# Patient Record
Sex: Male | Born: 1950 | Race: White | Marital: Married | Smoking: Former smoker
Health system: Southern US, Community
[De-identification: ages and names within clinical notes are randomized; demographics above are authoritative.]

## PROBLEM LIST (undated history)

## (undated) DIAGNOSIS — Z87442 Personal history of urinary calculi: Secondary | ICD-10-CM

## (undated) DIAGNOSIS — G4730 Sleep apnea, unspecified: Secondary | ICD-10-CM

## (undated) DIAGNOSIS — C801 Malignant (primary) neoplasm, unspecified: Secondary | ICD-10-CM

## (undated) DIAGNOSIS — K3533 Acute appendicitis with perforation and localized peritonitis, with abscess: Secondary | ICD-10-CM

## (undated) DIAGNOSIS — M109 Gout, unspecified: Secondary | ICD-10-CM

## (undated) DIAGNOSIS — F419 Anxiety disorder, unspecified: Secondary | ICD-10-CM

## (undated) DIAGNOSIS — H269 Unspecified cataract: Secondary | ICD-10-CM

## (undated) DIAGNOSIS — M1990 Unspecified osteoarthritis, unspecified site: Secondary | ICD-10-CM

## (undated) DIAGNOSIS — J302 Other seasonal allergic rhinitis: Secondary | ICD-10-CM

## (undated) DIAGNOSIS — F1020 Alcohol dependence, uncomplicated: Secondary | ICD-10-CM

## (undated) DIAGNOSIS — K219 Gastro-esophageal reflux disease without esophagitis: Secondary | ICD-10-CM

## (undated) DIAGNOSIS — F32A Depression, unspecified: Secondary | ICD-10-CM

## (undated) DIAGNOSIS — I10 Essential (primary) hypertension: Secondary | ICD-10-CM

## (undated) DIAGNOSIS — E785 Hyperlipidemia, unspecified: Secondary | ICD-10-CM

## (undated) DIAGNOSIS — E669 Obesity, unspecified: Secondary | ICD-10-CM

## (undated) HISTORY — PX: APPENDECTOMY: SHX54

## (undated) HISTORY — DX: Unspecified cataract: H26.9

## (undated) HISTORY — DX: Gastro-esophageal reflux disease without esophagitis: K21.9

## (undated) HISTORY — DX: Anxiety disorder, unspecified: F41.9

## (undated) HISTORY — PX: WISDOM TOOTH EXTRACTION: SHX21

## (undated) HISTORY — DX: Gout, unspecified: M10.9

## (undated) HISTORY — PX: HAMMER TOE SURGERY: SHX385

## (undated) HISTORY — DX: Other seasonal allergic rhinitis: J30.2

## (undated) HISTORY — DX: Essential (primary) hypertension: I10

## (undated) HISTORY — DX: Acute appendicitis with perforation, localized peritonitis, and gangrene, with abscess: K35.33

## (undated) HISTORY — DX: Depression, unspecified: F32.A

## (undated) HISTORY — PX: TONSILLECTOMY: SUR1361

## (undated) HISTORY — PX: COLONOSCOPY: SHX174

## (undated) HISTORY — DX: Obesity, unspecified: E66.9

## (undated) HISTORY — DX: Unspecified osteoarthritis, unspecified site: M19.90

## (undated) HISTORY — DX: Alcohol dependence, uncomplicated: F10.20

## (undated) HISTORY — DX: Hyperlipidemia, unspecified: E78.5

---

## 2015-03-11 ENCOUNTER — Encounter: Payer: Self-pay | Admitting: Gastroenterology

## 2015-03-11 ENCOUNTER — Other Ambulatory Visit (INDEPENDENT_AMBULATORY_CARE_PROVIDER_SITE_OTHER): Payer: BLUE CROSS/BLUE SHIELD

## 2015-03-11 ENCOUNTER — Ambulatory Visit (INDEPENDENT_AMBULATORY_CARE_PROVIDER_SITE_OTHER): Payer: BLUE CROSS/BLUE SHIELD | Admitting: Gastroenterology

## 2015-03-11 VITALS — BP 128/70 | HR 76 | Ht 74.0 in | Wt 267.0 lb

## 2015-03-11 DIAGNOSIS — K766 Portal hypertension: Secondary | ICD-10-CM | POA: Diagnosis not present

## 2015-03-11 LAB — CBC WITH DIFFERENTIAL/PLATELET
BASOS ABS: 0 10*3/uL (ref 0.0–0.1)
Basophils Relative: 0.7 % (ref 0.0–3.0)
EOS ABS: 0.2 10*3/uL (ref 0.0–0.7)
Eosinophils Relative: 2.4 % (ref 0.0–5.0)
HEMATOCRIT: 45.3 % (ref 39.0–52.0)
Hemoglobin: 15.3 g/dL (ref 13.0–17.0)
LYMPHS PCT: 32.4 % (ref 12.0–46.0)
Lymphs Abs: 2.1 10*3/uL (ref 0.7–4.0)
MCHC: 33.8 g/dL (ref 30.0–36.0)
MCV: 90.9 fl (ref 78.0–100.0)
MONO ABS: 0.6 10*3/uL (ref 0.1–1.0)
Monocytes Relative: 10 % (ref 3.0–12.0)
NEUTROS ABS: 3.5 10*3/uL (ref 1.4–7.7)
Neutrophils Relative %: 54.5 % (ref 43.0–77.0)
PLATELETS: 320 10*3/uL (ref 150.0–400.0)
RBC: 4.99 Mil/uL (ref 4.22–5.81)
RDW: 12.9 % (ref 11.5–15.5)
WBC: 6.4 10*3/uL (ref 4.0–10.5)

## 2015-03-11 LAB — COMPREHENSIVE METABOLIC PANEL
ALBUMIN: 4.2 g/dL (ref 3.5–5.2)
ALK PHOS: 62 U/L (ref 39–117)
ALT: 17 U/L (ref 0–53)
AST: 16 U/L (ref 0–37)
BUN: 23 mg/dL (ref 6–23)
CALCIUM: 10.2 mg/dL (ref 8.4–10.5)
CHLORIDE: 101 meq/L (ref 96–112)
CO2: 32 mEq/L (ref 19–32)
Creatinine, Ser: 1.05 mg/dL (ref 0.40–1.50)
GFR: 75.93 mL/min (ref >60.00)
Glucose, Bld: 106 mg/dL — ABNORMAL HIGH (ref 70–99)
POTASSIUM: 4.1 meq/L (ref 3.5–5.1)
SODIUM: 138 meq/L (ref 135–145)
Total Bilirubin: 0.3 mg/dL (ref 0.2–1.2)
Total Protein: 7.4 g/dL (ref 6.0–8.3)

## 2015-03-11 LAB — PROTIME-INR
INR: 1.1 ratio — AB (ref 0.8–1.0)
PROTHROMBIN TIME: 12.1 s (ref 9.6–13.1)

## 2015-03-11 NOTE — Patient Instructions (Addendum)
We will get records sent from your previous gastroenterologist in [place] for review.  This will include any endoscopic (colonoscopy or upper endoscopy) procedures and any associated pathology reports and also any recent office notes. After reviewing the above records, Dr. Ardis Hughs will decide on future testing (colonoscopy surveillance, screening). You will have labs checked today in the basement lab.  Please head down after you check out with the front desk  (cbc, cmet, inr). You will be set up for an ultrasound with elastography.  You will be set up for an ultrasound with duplex evaluation of liver vessels. Continue the most important thing, no more drinking.  You have been scheduled for an ultrasound at [facility] (1st floor of hospital) on 04-07-15 at 7:00am. Please arrive 15 minutes prior to your appointment for registration. Make certain not to have anything to eat or drink 6 hours prior to your appointment. Should you need to reschedule your appointment, please contact radiology at [phone].

## 2015-03-11 NOTE — Progress Notes (Signed)
HPI: This is a  very pleasant 64 year old man    who was referred to me by Margaretha Sheffield to evaluate  possibility of portal hypertension .    Chief complaint is " I was told I had portal hypertension"   I reviewed patient hand carried records from [facility], [place] from July 2016. This includes a chest abdomen and pelvic CT scan. "The liver is normal in size without gross evidence of masses or dilated intrahepatic ducts". He had a liver, spleen scan done that is reported as being normal. The reason for the exam was "portal hypertension". Labs from that admission which related to a perforated appendicitis that required laparoscopic converted to open appendectomy showed normal platelets his INR was elevated at 1.6 his albumin was 2.8 his bilirubin was normal as AST and ALP were both normal.  I wrote op note from that admission, August 2016 surgery for perforated appendicitis, chronic. Apparently he presented originally in July with a perforated appendicitis and a large abscess this underwent drain placement. His history was "notable for very large alcohol history" surgeon says that there was a modest amount of bleeding from just under the umbilicus that was "later confirmed to be related to portal hypertension"  Dizziness with looking up. [place] Alcoholic but stopped drinking about a month prior to his acute illness  Colonoscopy for colon cancer screening about 2 years ago was normal.  He has never had trouble with edema, encephalopathy, never any overt GI bleeding.  Review of systems: Pertinent positive and negative review of systems were noted in the above HPI section. Complete review of systems was performed and was otherwise normal.   Past Medical History  Diagnosis Date  . Appendicitis with abscess     No past surgical history on file.  Current Outpatient Prescriptions  Medication Sig Dispense Refill  . olmesartan-hydrochlorothiazide (BENICAR HCT) 20-12.5 MG tablet  Take 1 tablet by mouth daily.    Marland Kitchen omeprazole (PRILOSEC) 20 MG capsule Take 20 mg by mouth daily.    . traZODone (DESYREL) 100 MG tablet Take 100 mg by mouth at bedtime.    Marland Kitchen venlafaxine (EFFEXOR) 75 MG tablet Take 75 mg by mouth daily.    Marland Kitchen zolpidem (AMBIEN CR) 12.5 MG CR tablet Take 12.5 mg by mouth at bedtime as needed for sleep.     No current facility-administered medications for this visit.    Allergies as of 03/11/2015  . (No Known Allergies)    No family history on file.  Social History   Social History  . Marital Status: Married    Spouse Name: N/A  . Number of Children: 2  . Years of Education: N/A   Occupational History  . retired    Social History Main Topics  . Smoking status: Former Smoker -- 1.00 packs/day for 9 years    Types: Cigarettes    Quit date: 05/09/1980  . Smokeless tobacco: Not on file  . Alcohol Use: No  . Drug Use: Not on file  . Sexual Activity: Not on file   Other Topics Concern  . Not on file   Social History Narrative  . No narrative on file     Physical Exam: BP 128/70 mmHg  Pulse 76  Ht 6\' 2"  (1.88 m)  Wt 267 lb (121.11 kg)  BMI 34.27 kg/m2 Constitutional: generally well-appearing Psychiatric: alert and oriented x3 Eyes: extraocular movements intact Mouth: oral pharynx moist, no lesions Neck: supple no lymphadenopathy Cardiovascular: heart regular rate and rhythm Lungs:  clear to auscultation bilaterally Abdomen: soft, nontender, nondistended, no obvious ascites, no peritoneal signs, normal bowel sounds Extremities: no lower extremity edema bilaterally Skin: no lesions on visible extremities   Assessment and plan: 64 y.o. male with  previous alcoholism, question of underlying portal hypertension  I read his operative report and it does seem like there was some, more than usual bleeding surgically. It is not clear to me that however he has underlying cirrhosis or portal hypertension. His liver looked normal, nonnodular on  CT scan. He also had a liver spleen scan which was normal. He clearly was an alcoholic and he has stopped drinking for the past 4-6 months.  Clinically he has no signs of cirrhosis. I'm going to get set of labs today including a CBC, complete metabolic profile and coags to check for underlying biochemical signs of cirrhosis. I also recommended he have duplex ultrasound of his liver to check for signs of portal hypertension as well as an ultrasound with the last either physical look for subtle signs of fibrosis, cirrhosis.   Owens Loffler, MD [facility] 03/11/2015, 10:21 AM  Cc: Margaretha Sheffield, MD

## 2015-04-07 ENCOUNTER — Ambulatory Visit (HOSPITAL_COMMUNITY)
Admission: RE | Admit: 2015-04-07 | Discharge: 2015-04-07 | Disposition: A | Discharge status: Home / Self Care | Payer: BLUE CROSS/BLUE SHIELD | Source: Ambulatory Visit | Attending: Gastroenterology | Admitting: Gastroenterology

## 2015-04-07 DIAGNOSIS — K766 Portal hypertension: Secondary | ICD-10-CM | POA: Insufficient documentation

## 2019-05-20 DIAGNOSIS — H903 Sensorineural hearing loss, bilateral: Secondary | ICD-10-CM | POA: Insufficient documentation

## 2020-01-17 ENCOUNTER — Other Ambulatory Visit: Payer: Self-pay | Admitting: Urology

## 2020-01-17 DIAGNOSIS — R972 Elevated prostate specific antigen [PSA]: Secondary | ICD-10-CM

## 2020-01-17 DIAGNOSIS — Z77018 Contact with and (suspected) exposure to other hazardous metals: Secondary | ICD-10-CM

## 2020-02-10 ENCOUNTER — Inpatient Hospital Stay: Admission: RE | Admit: 2020-02-10 | Payer: BLUE CROSS/BLUE SHIELD | Source: Ambulatory Visit

## 2020-02-28 ENCOUNTER — Inpatient Hospital Stay: Admission: RE | Admit: 2020-02-28 | Payer: BLUE CROSS/BLUE SHIELD | Source: Ambulatory Visit

## 2020-02-29 ENCOUNTER — Other Ambulatory Visit: Payer: Self-pay

## 2020-02-29 ENCOUNTER — Ambulatory Visit
Admission: RE | Admit: 2020-02-29 | Discharge: 2020-02-29 | Disposition: A | Discharge status: Home / Self Care | Payer: Medicare HMO | Source: Ambulatory Visit | Attending: Urology | Admitting: Urology

## 2020-02-29 DIAGNOSIS — R972 Elevated prostate specific antigen [PSA]: Secondary | ICD-10-CM

## 2020-02-29 MED ORDER — GADOBENATE DIMEGLUMINE 529 MG/ML IV SOLN
20.0000 mL | Freq: Once | INTRAVENOUS | Status: AC | PRN
  Administered 2020-02-29: 20 mL via INTRAVENOUS

## 2020-10-26 ENCOUNTER — Ambulatory Visit (AMBULATORY_SURGERY_CENTER): Payer: BLUE CROSS/BLUE SHIELD

## 2020-10-26 ENCOUNTER — Other Ambulatory Visit: Payer: Self-pay

## 2020-10-26 VITALS — Ht 74.0 in | Wt 215.0 lb

## 2020-10-26 DIAGNOSIS — Z1211 Encounter for screening for malignant neoplasm of colon: Secondary | ICD-10-CM

## 2020-10-26 MED ORDER — SUTAB 1479-225-188 MG PO TABS: 1.0000 | ORAL_TABLET | ORAL | 0 refills | Status: DC

## 2020-10-26 NOTE — Progress Notes (Signed)
Pre visit completed via phone call; Patient verified name, DOB, and address; No egg or soy allergy known to patient  No issues with past sedation with any surgeries or procedures Patient denies ever being told they had issues or difficulty with intubation  No FH of Malignant Hyperthermia No diet pills per patient No home 02 use per patient  No blood thinners per patient  Pt denies issues with constipation  No A fib or A flutter  COVID 19 guidelines implemented in PV today with Pt and RN   Medicare Coupon given to pt in PV today, Code to Pharmacy and NO PA's for preps discussed with pt in PV today  Discussed with pt there will be an out-of-pocket cost for prep and that varies from $0 to 70 dollars   Due to the COVID-19 pandemic we are asking patients to follow certain guidelines.  Pt aware of COVID protocols and [facility] guidelines

## 2020-11-11 ENCOUNTER — Ambulatory Visit (AMBULATORY_SURGERY_CENTER): Payer: Medicare HMO | Admitting: Gastroenterology

## 2020-11-11 ENCOUNTER — Encounter: Payer: Self-pay | Admitting: Gastroenterology

## 2020-11-11 ENCOUNTER — Other Ambulatory Visit: Payer: Self-pay

## 2020-11-11 VITALS — BP 97/70 | HR 60 | Temp 97.5°F | Resp 21 | Ht 74.0 in | Wt 215.0 lb

## 2020-11-11 DIAGNOSIS — Z1211 Encounter for screening for malignant neoplasm of colon: Secondary | ICD-10-CM

## 2020-11-11 DIAGNOSIS — D123 Benign neoplasm of transverse colon: Secondary | ICD-10-CM

## 2020-11-11 DIAGNOSIS — K635 Polyp of colon: Secondary | ICD-10-CM

## 2020-11-11 MED ORDER — SODIUM CHLORIDE 0.9 % IV SOLN: 500.0000 mL | Freq: Once | INTRAVENOUS | Status: AC

## 2020-11-11 NOTE — Progress Notes (Signed)
A and O x3. Report to RN. Tolerated MAC anesthesia well. 

## 2020-11-11 NOTE — Op Note (Signed)
[name] Procedure Date: 11/11/2020 9:14 AM MRN: [id number] Endoscopist: Milus Banister , MD Age: 70 Referring MD:  Date of Birth: 10/01/1950 Gender: Male Account #: [id number] Procedure:                Colonoscopy Indications:              Screening for colorectal malignant neoplasm Medicines:                Monitored Anesthesia Care Procedure:                Pre-Anesthesia Assessment:                           - Prior to the procedure, a History and Physical                            was performed, and patient medications and                            allergies were reviewed. The patient's tolerance of                            previous anesthesia was also reviewed. The risks                            and benefits of the procedure and the sedation                            options and risks were discussed with the patient.                            All questions were answered, and informed consent                            was obtained. Prior Anticoagulants: The patient has                            taken no previous anticoagulant or antiplatelet                            agents. ASA Grade Assessment: II - A patient with                            mild systemic disease. After reviewing the risks                            and benefits, the patient was deemed in                            satisfactory condition to undergo the procedure.                           After obtaining informed consent, the colonoscope  was passed under direct vision. Throughout the                            procedure, the patient's blood pressure, pulse, and                            oxygen saturations were monitored continuously. The                            Olympus CF-HQ190L [id number]) Colonoscope was                            introduced through the anus and advanced to the the                            cecum, identified by  appendiceal orifice and                            ileocecal valve. The colonoscopy was performed                            without difficulty. The patient tolerated the                            procedure well. The quality of the bowel                            preparation was adequate. The ileocecal valve,                            appendiceal orifice, and rectum were photographed. Scope In: 9:25:54 AM Scope Out: 9:43:38 AM Scope Withdrawal Time: 0 hours 7 minutes 46 seconds  Total Procedure Duration: 0 hours 17 minutes 44 seconds  Findings:                 A 3 mm polyp was found in the transverse colon. The                            polyp was sessile. The polyp was removed with a                            cold snare. Resection and retrieval were complete.                           Multiple small-mouthed diverticula were found in                            the left colon.                           Internal hemorrhoids were found. The hemorrhoids                            were small.  The exam was otherwise without abnormality on                            direct and retroflexion views. Complications:            No immediate complications. Estimated blood loss:                            None. Estimated Blood Loss:     Estimated blood loss: none. Impression:               - One 3 mm polyp in the transverse colon, removed                            with a cold snare. Resected and retrieved.                           - Diverticulosis in the left colon.                           - Internal hemorrhoids.                           - The examination was otherwise normal on direct                            and retroflexion views. Recommendation:           - Patient has a contact number available for                            emergencies. The signs and symptoms of potential                            delayed complications were discussed with the                             patient. Return to normal activities tomorrow.                            Written discharge instructions were provided to the                            patient.                           - Resume previous diet.                           - Continue present medications.                           - Await pathology results. Milus Banister, MD 11/11/2020 9:50:13 AM This report has been signed electronically.

## 2020-11-11 NOTE — Progress Notes (Signed)
Pt's states no medical or surgical changes since previsit or office visit. 

## 2020-11-11 NOTE — Patient Instructions (Signed)
Discharge instructions given. Handouts on polyps and diverticulosis and hemorrhoids. Resume previous medications. YOU HAD AN ENDOSCOPIC PROCEDURE TODAY AT [facility]:   Refer to the procedure report that was given to you for any specific questions about what was found during the examination.  If the procedure report does not answer your questions, please call your gastroenterologist to clarify.  If you requested that your care partner not be given the details of your procedure findings, then the procedure report has been included in a sealed envelope for you to review at your convenience later.  YOU SHOULD EXPECT: Some feelings of bloating in the abdomen. Passage of more gas than usual.  Walking can help get rid of the air that was put into your GI tract during the procedure and reduce the bloating. If you had a lower endoscopy (such as a colonoscopy or flexible sigmoidoscopy) you may notice spotting of blood in your stool or on the toilet paper. If you underwent a bowel prep for your procedure, you may not have a normal bowel movement for a few days.  Please Note:  You might notice some irritation and congestion in your nose or some drainage.  This is from the oxygen used during your procedure.  There is no need for concern and it should clear up in a day or so.  SYMPTOMS TO REPORT IMMEDIATELY:  Following lower endoscopy (colonoscopy or flexible sigmoidoscopy):  Excessive amounts of blood in the stool  Significant tenderness or worsening of abdominal pains  Swelling of the abdomen that is new, acute  Fever of 100F or higher   For urgent or emergent issues, a gastroenterologist can be reached at any hour by calling [phone]. Do not use MyChart messaging for urgent concerns.    DIET:  We do recommend a small meal at first, but then you may proceed to your regular diet.  Drink plenty of fluids but you should avoid alcoholic beverages for 24 hours.  ACTIVITY:  You should  plan to take it easy for the rest of today and you should NOT DRIVE or use heavy machinery until tomorrow (because of the sedation medicines used during the test).    FOLLOW UP: Our staff will call the number listed on your records 48-72 hours following your procedure to check on you and address any questions or concerns that you may have regarding the information given to you following your procedure. If we do not reach you, we will leave a message.  We will attempt to reach you two times.  During this call, we will ask if you have developed any symptoms of COVID 19. If you develop any symptoms (ie: fever, flu-like symptoms, shortness of breath, cough etc.) before then, please call [phone].  If you test positive for Covid 19 in the 2 weeks post procedure, please call and report this information to [place].    If any biopsies were taken you will be contacted by phone or by letter within the next 1-3 weeks.  Please call us at [phone] if you have not heard about the biopsies in 3 weeks.    SIGNATURES/CONFIDENTIALITY: You and/or your care partner have signed paperwork which will be entered into your electronic medical record.  These signatures attest to the fact that that the information above on your After Visit Summary has been reviewed and is understood.  Full responsibility of the confidentiality of this discharge information lies with you and/or your care-partner.

## 2020-11-17 ENCOUNTER — Encounter: Payer: Self-pay | Admitting: Gastroenterology

## 2021-02-12 ENCOUNTER — Ambulatory Visit: Payer: Medicare HMO | Admitting: Gastroenterology

## 2021-02-16 ENCOUNTER — Encounter: Payer: Self-pay | Admitting: Internal Medicine

## 2021-09-17 ENCOUNTER — Other Ambulatory Visit: Payer: Self-pay | Admitting: Urology

## 2021-09-17 DIAGNOSIS — R972 Elevated prostate specific antigen [PSA]: Secondary | ICD-10-CM

## 2021-10-13 ENCOUNTER — Ambulatory Visit
Admission: RE | Admit: 2021-10-13 | Discharge: 2021-10-13 | Disposition: A | Discharge status: Home / Self Care | Payer: Medicare HMO | Source: Ambulatory Visit | Attending: Urology | Admitting: Urology

## 2021-10-13 DIAGNOSIS — R972 Elevated prostate specific antigen [PSA]: Secondary | ICD-10-CM

## 2021-10-13 MED ORDER — GADOBENATE DIMEGLUMINE 529 MG/ML IV SOLN
20.0000 mL | Freq: Once | INTRAVENOUS | Status: AC | PRN
  Administered 2021-10-13: 20 mL via INTRAVENOUS

## 2023-09-07 ENCOUNTER — Encounter (HOSPITAL_BASED_OUTPATIENT_CLINIC_OR_DEPARTMENT_OTHER): Payer: Self-pay | Admitting: Internal Medicine

## 2023-09-07 DIAGNOSIS — G4733 Obstructive sleep apnea (adult) (pediatric): Secondary | ICD-10-CM

## 2023-09-07 DIAGNOSIS — G4700 Insomnia, unspecified: Secondary | ICD-10-CM

## 2023-12-10 ENCOUNTER — Ambulatory Visit (HOSPITAL_BASED_OUTPATIENT_CLINIC_OR_DEPARTMENT_OTHER): Attending: Nurse Practitioner | Admitting: Internal Medicine

## 2023-12-10 VITALS — Ht 74.0 in | Wt 225.0 lb

## 2023-12-10 DIAGNOSIS — G4761 Periodic limb movement disorder: Secondary | ICD-10-CM | POA: Diagnosis not present

## 2023-12-10 DIAGNOSIS — R0683 Snoring: Secondary | ICD-10-CM | POA: Diagnosis present

## 2023-12-10 DIAGNOSIS — G4700 Insomnia, unspecified: Secondary | ICD-10-CM

## 2023-12-10 DIAGNOSIS — G4733 Obstructive sleep apnea (adult) (pediatric): Secondary | ICD-10-CM

## 2023-12-11 IMAGING — MR MR PROSTATE WO/W CM
12 series · 48 of 48 positions shown · IV contrast (multihance)
Comparison: 02/29/2020

CLINICAL DATA: Elevated PSA.

EXAM:
MR PROSTATE WITHOUT AND WITH CONTRAST
TECHNIQUE: Multiplanar multisequence MRI images were obtained of the pelvis
centered about the prostate. Pre and post contrast images were
obtained.
CONTRAST:  20mL MULTIHANCE GADOBENATE DIMEGLUMINE 529 MG/ML IV SOLN

[Series 3: T2 · coronal · 3.0mm · 0.56mm/px · 1 of 25 slices shown (1 of 3)]
[im 1/25]
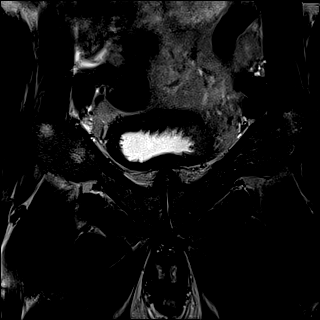

[Series 4: T1 · axial · 5.0mm · 1.25mm/px · z∈[-3,+212]mm · 2 of 88 slices shown]
[im 1/88]
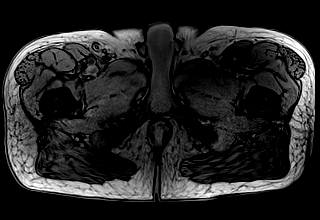
[im 88/88]
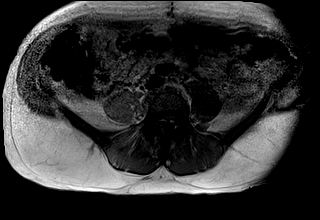

[Series 5: DWI · axial · 3.0mm · 1.75mm/px · z∈[+20,+92]mm · 2 of 73 slices shown (1 of 3)]
[im 1/73]
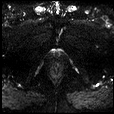
[im 73/73]
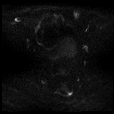

[Series 6: DWI · axial · 3.0mm · 1.75mm/px · 1 of 25 slices shown (2 of 3)]
[im 1/25]
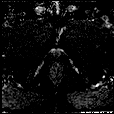

[Series 7: DWI · axial · 3.0mm · 1.75mm/px · 1 of 25 slices shown (3 of 3)]
[im 1/25]
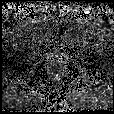

[Series 8: T2 · axial · 3.0mm · 0.70mm/px · 1 of 27 slices shown (2 of 3)]
[im 1/27]
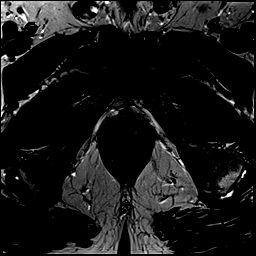

[Series 9: T2 · axial · 1.0mm · 1.04mm/px · z∈[+20,+91]mm · 2 of 72 slices shown (3 of 3)]
[im 1/72]
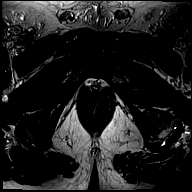
[im 72/72]
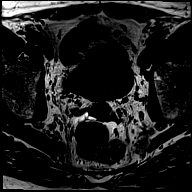

[Series 10: pre t1_twist_tra_dyn · axial · non-contrast · 3.5mm · 0.83mm/px · 1 of 20 slices shown]
[im 1/20]
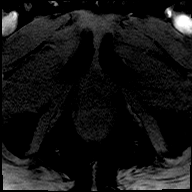

[Series 11: post t1_twist_tra_dyn-copy center · axial · non-contrast · 3.5mm · 0.83mm/px · z∈[+22,+89]mm · 17 of 600 slices shown]
[im 1/600]
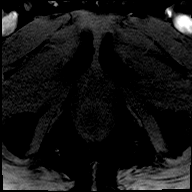
[im 38/600]
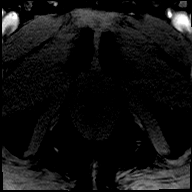
[im 75/600]
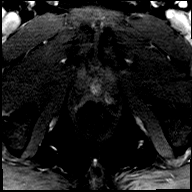
[im 113/600]
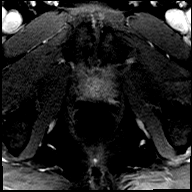
[im 150/600]
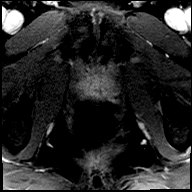
[im 188/600]
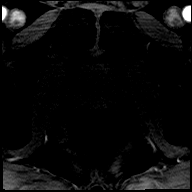
[im 225/600]
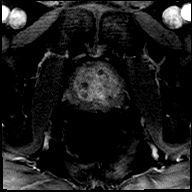
[im 263/600]
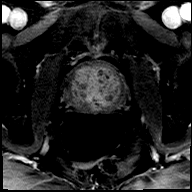
[im 300/600]
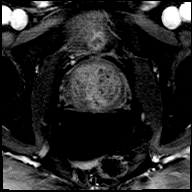
[im 337/600]
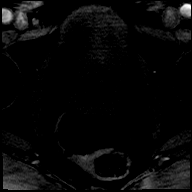
[im 375/600]
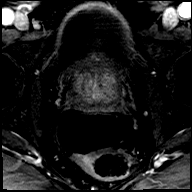
[im 412/600]
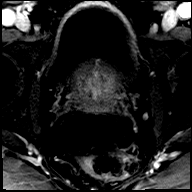
[im 450/600]
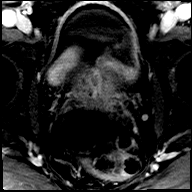
[im 487/600]
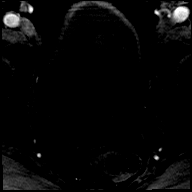
[im 525/600]
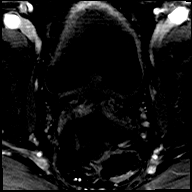
[im 562/600]
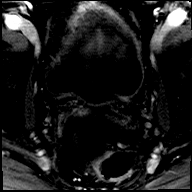
[im 600/600]
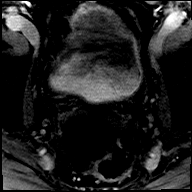

[Series 12: post t1_twist_tra_dyn-copy cent_sub · axial · 3.5mm · 0.83mm/px · z∈[+22,+89]mm · 16 of 580 slices shown]
[im 1/580]
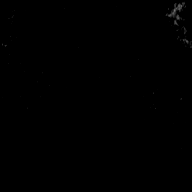
[im 39/580]
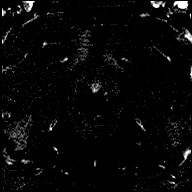
[im 78/580]
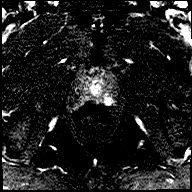
[im 116/580]
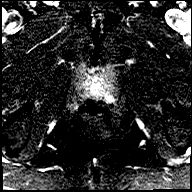
[im 155/580]
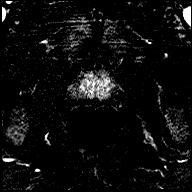
[im 194/580]
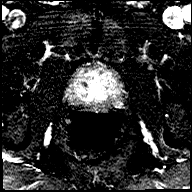
[im 232/580]
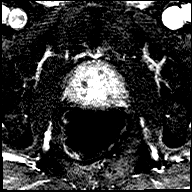
[im 271/580]
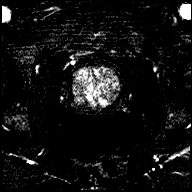
[im 309/580]
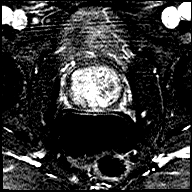
[im 348/580]
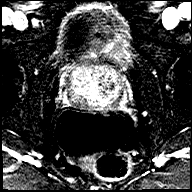
[im 387/580]
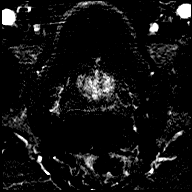
[im 425/580]
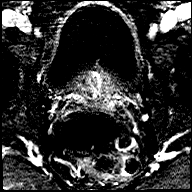
[im 464/580]
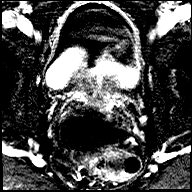
[im 502/580]
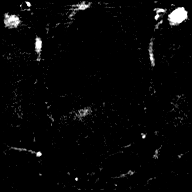
[im 541/580]
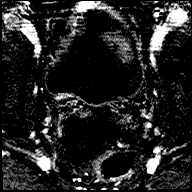
[im 580/580]
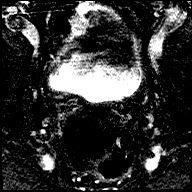

[Series 13: t1_vibe_dixon_tra_f · axial · 2.5mm · 0.91mm/px · z∈[+6,+204]mm · 2 of 80 slices shown]
[im 1/80]
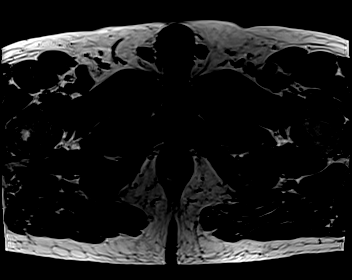
[im 80/80]
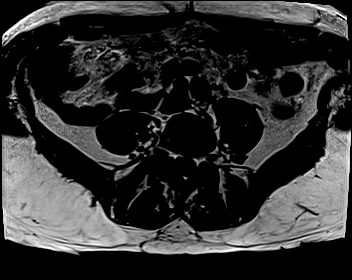

[Series 14: t1_vibe_dixon_tra_w · axial · 2.5mm · 0.91mm/px · z∈[+6,+204]mm · 2 of 80 slices shown]
[im 1/80]
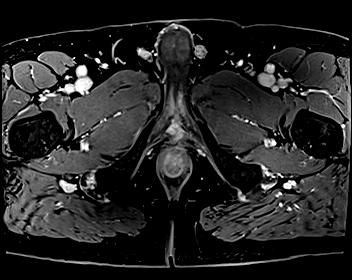
[im 80/80]
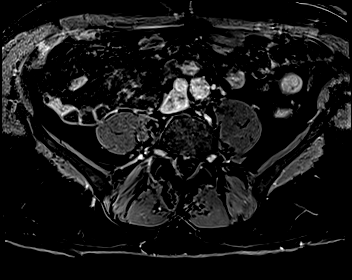

[48 of 48 positions shown; findings below may reference images not displayed]

FINDINGS: Prostate:

-- Peripheral Zone: No focal lesion seen on ADC or high b-value DWI
sequences.

-- Transition/Central Zone: Mildly enlarged with multiple BPH
nodules. No nodules with suspicious characteristics on T2-weighted
imaging.

-- Measurements/Volume:  5.2 x 4.0 x 5.3 cm (volume = 58 cm^3)

Transcapsular spread:  Absent

Seminal vesicle involvement:  Absent

Neurovascular bundle involvement:  Absent

Pelvic adenopathy: None visualized

Bone metastasis: None visualized

Other:  None
IMPRESSION: No radiographic evidence of high-grade prostate carcinoma. PI-RADS 1
(v2.1): Very Low (clinically significant cancer highly unlikely)

## 2023-12-12 NOTE — Procedures (Signed)
 Orders only

## 2023-12-24 NOTE — Procedures (Signed)
 [facility] [address] [place], [place] [address] Tel: [phone]   Fax: [phone]  Polysomnography Interpretation  Patient Name:  Brian Mcintyre, Brian Mcintyre Date:  12/10/2023 Referring Physician:  COREAN CRUMPTON [phone]) %%star%% Indications for Polysomnography The patient is a 73 year old Male who is 6' 2 and weighs 225.0 lbs. His BMI equals 28.9.  A full night polysomnogram was performed to evaluate for -OSA  Medication  TRAZODONE  BENICAR HCT   Polysomnogram Data A full night polysomnogram recorded the standard physiologic parameters including EEG, EOG, EMG, EKG, nasal and oral airflow.  Respiratory parameters of chest and abdominal movements were recorded with Respiratory Inductance Plethysmography belts.  Oxygen saturation was recorded by pulse oximetry.   Sleep Architecture The total recording time of the polysomnogram was 439.2 minutes.  The total sleep time was 321.0 minutes.  The patient spent 17.0% of total sleep time in Stage N1, 61.5% in Stage N2, 21.5% in Stages N3, and 0.0% in REM.  Sleep latency was 0.5 minutes.  REM latency was N/A minutes.  Sleep Efficiency was 73.1%.  Wake after Sleep Onset time was 117.5 minutes.  Respiratory Events The polysomnogram revealed a presence of 1 obstructive, 1 central, and 0 mixed apneas resulting in an Apnea index of 0.4 events per hour.  There were 0 hypopneas (>=3% desaturation and/or arousal) resulting in an Apnea\Hypopnea Index (AHI >=3% desaturation and/or arousal) of 0.4 events per hour.  There were 0 hypopneas (>=4% desaturation) resulting in an Apnea\Hypopnea Index (AHI >=4% desaturation) of 0.4 events per hour.  There were 28 Respiratory Effort Related Arousals resulting in a RERA index of 5.2 events per hour. The Respiratory Disturbance Index is 5.6 events per hour.  The snore index was 105.6 events per hour.  Mean oxygen saturation was 93.8%.  The lowest oxygen saturation during  sleep was 90.0%.  Time spent <=88% oxygen saturation was - minutes (-).  Limb Activity There were 211 total limb movements recorded, of this total, 205 were classified as PLMs.  PLM index was 38.3 per hour and PLM associated with Arousals index was 0.9 per hour.  Cardiac Summary The average pulse rate was 58.7 bpm.  The minimum pulse rate was 48.0 bpm while the maximum pulse rate was 94.0 bpm.  Cardiac rhythm was normal.  Comments:  Occasional apneas and hypopneas, within normal limits, AHI (4%) 0.4/hr. Snoring with oxygen desaturation nadir 90%, mean 93.8%. Periodic limb movement with arousal.  Diagnosis: Primary snoring. Periodic limb movement.  Recommendations: Manage for snoring and limb movement sleep disturbance based on clinical judgment.   This study was personally reviewed and electronically signed by: NEYSA REGGY BIRCH, MD Accredited Board Certified in Sleep Medicine Date/Time: 12/24/23   12:58    %%en  Diagnostic PSG Report  Patient Name: Brian Mcintyre, Brian Mcintyre Date: 12/10/2023  Date of Birth: [birth year] Study Type: Diagnostic  Age: 58 year MRN #: [id number]  Sex: Male Interpreting Physician: NEYSA REGGY, [id number]  Height: 6' 2 Referring Physician: COREAN CRUMPTON [phone])  Weight: 225.0 lbs Recording Tech: Charlie George RPSGT  BMI: 28.9 Scoring Tech: Will Poet RRT RCP RPSGT   ESS: 1 Neck Size: 15.75   Study Overview  Lights Off: 09:44:01 PM  Count Index  Lights On: 05:03:14 AM Awakenings: 51 9.5  Time in Bed: 439.2 min. Arousals: 125 23.4  Total Sleep Time: 321.0 min. AHI (>=3% Desat and/or Ar.): 2 0.4   Sleep Efficiency: 73.1% AHI (>=4% Desat): 2 0.4   Sleep  Latency: 0.5 min. Limb Movements: 211 39.4  Wake After Sleep Onset: 117.5 min. Snore: 565 105.6  REM Latency from Sleep Onset: - min. Desaturations: 1 0.2     Minimum SpO2 TST: 90.0%    Sleep Architecture  % of Time in Bed Stages Time (mins) % Sleep Time  Wake 118.5   Stage N1 54.5 17.0%  Stage  N2 197.5 61.5%  Stage N3 69.0 21.5%  REM 0.0 0.0%   Arousal Summary   NREM REM Sleep Index  Respiratory Arousals 26 - 26 4.9  PLM Arousals 5 - 5 0.9  Isolated Limb Movement Arousals 1 - 1 0.2  Snore Arousals 3 - 3 0.6  Spontaneous Arousals 90 - 90 16.8  Total 125 - 125 23.4   Limb Movement Summary   Count Index  Isolated Limb Movements 6 1.1  Periodic Limb Movements (PLMs) 205 38.3  Total Limb Movements 211 39.4    Respiratory Summary   By Sleep Stage By Body Position Total   NREM REM Supine Non-Supine   Time (min) 321.0 0.0 41.5 279.5 321.0         Obstructive Apnea 1 - - 1 1  Mixed Apnea - - - - -  Central Apnea 1 - - 1 1  Total Apneas 2 - - 2 2  Total Apnea Index 0.4 - - 0.4 0.4         Hypopneas (>=3% Desat and/or Ar.) - - - - -  AHI (>=3% Desat and/or Ar.) 0.4 - - 0.4 0.4         Hypopneas (>=4% Desat) - - - - -  AHI (>=4% Desat) 0.4 - - 0.4 0.4          RERAs 28 - 3 25 28   RERA Index 5.2 - 4.3 5.4 5.2         RDI 5.6 - 4.3 5.8 5.6    Respiratory Event Type Index  Central Apneas 0.2  Obstructive Apneas 0.2  Mixed Apneas -  Central Hypopneas -  Obstructive Hypopneas -  Central Apnea + Hypopnea (CAHI) 0.2  Obstructive Apnea + Hypopnea (OAHI) 0.2   Respiratory Event Durations   Apnea Hypopnea   NREM REM NREM REM  Average (seconds) 16.6 - - -  Maximum (seconds) 18.2 - - -    Oxygen Saturation Summary   Wake NREM REM TST TIB  Average SpO2 (%) 93.8% 93.8% - 93.8% 93.8%  Minimum SpO2 (%) 90.0% 90.0% - 90.0% 90.0%  Maximum SpO2 (%) 98.0% 98.0% - 98.0% 98.0%   Oxygen Saturation Distribution  Range (%) Time in range (min) Time in range (%)  90.0 - 100.0 432.0 99.8%  80.0 - 90.0 0.2 0.0%  70.0 - 80.0 - -  60.0 - 70.0 - -  50.0 - 60.0 - -  0.0 - 50.0 - -  Time Spent <=88% SpO2  Range (%) Time in range (min) Time in range (%)  0.0 - 88.0 - -      Count Index  Desaturations 1 0.2    Cardiac Summary   Wake NREM REM Sleep Total  Average  Pulse Rate (BPM) 60.4 58.0 - 58.0 58.7  Minimum Pulse Rate (BPM) 48.0 49.0 - 49.0 48.0  Maximum Pulse Rate (BPM) 87.0 94.0 - 94.0 94.0   Pulse Rate Distribution:  Range (bpm) Time in range (min) Time in range (%)  0.0 - 40.0 - -  40.0 - 60.0 296.0 68.4%  60.0 - 80.0 135.1 31.2%  80.0 -  100.0 0.9 0.2%  100.0 - 120.0 - -  120.0 - 140.0 - -  140.0 - 200.0 - -      Hypnograms                      Technologist Comments  The patient arrived for a diagnostic PSG study. The patient was placed in room 6. The patient has a history of PAP and oral appliance use, but has since lost weight and is checking to see if therapy is needed. The procedure was explained, and no questions were asked. The patient slept in the left, right, and supine positions. Oral venting and moderate PLMs were observed. No bruxism, seizure, or spike wave activity was observed. Snoring was mild. No ECG arrhythmias were observed. The patient had one-bathroom break.                          Reggy Salt Diplomate, Biomedical engineer of Sleep Medicine  ELECTRONICALLY SIGNED ON:  12/24/2023, 12:52 PM [facility] PH: [phone]   FX: [phone] ACCREDITED BY THE [employer]ICAN ACADEMY OF SLEEP MEDICINE

## 2024-02-29 ENCOUNTER — Other Ambulatory Visit: Payer: Self-pay | Admitting: Urology

## 2024-03-01 ENCOUNTER — Encounter (HOSPITAL_COMMUNITY): Payer: Self-pay | Admitting: Urology

## 2024-03-01 ENCOUNTER — Other Ambulatory Visit: Payer: Self-pay

## 2024-03-01 NOTE — Progress Notes (Addendum)
 [facility] PHONE CALL   ALLERGIES REVIEWED: YES  MEDICATION REVIEW DONE: YES MEDICATIONS THAT PT SHOULD HOLD (LIST): Benicar, Aleve 48 HRS  CAN PT WALK UP STAIRS WITHOUT SHORTNESS OF BREATH: YES HOME O2: NO CPAP: NO  IF YES, INFORMED PT TO BRING CPAP WITH TUBING AND MASK:NO   INFORMED DRIVER NEEDED FOR PROCEDURE: YES   PT WAS GIVEN BLUE FOLDER AT UROLOGY APPT: NO- patient plans to pick up from office 03/05/24 PT INFORMED TO BRING BLUE FOLDER WITH ALL CONTENTS: YES  REVIEWED ARRIVAL TIME AND LOCATION: YES  OTHER PERTINENT INFORMATION:

## 2024-03-06 NOTE — H&P (Signed)
 CC/HPI: cc: Elevated PSA, BPH and ED, urolithiasis   12/26/19: 73 year old man with a history of elevated PSA status post negative prostate biopsy in 2011 and 2017, ED, and BPH. Patient is using tamsulosin 0.4 mg q.h.s. as well as supplemental for his BPH. This is working okay for him. Patient uses sildenafil which is working well for him. He had a repeat PSA before the visit which came back at 6.46. MRI in October 2021 showed a 62 g prostate with no concerning lesions.    PSA 6/11 - 4.04  TRUS/Bx 10/14/09 - BPH only  PSA 10/17 - 5.28  TRUS/Bx 02/21/16 - BPH only (including transition zone biopsies)  MRI October 2021: 62g prostate, PIRADS 2 lesion  MRI June 2023: 58 g prostate, PI-RADS 2 lesion   07/28/2021: 73 year old man with a history of an elevated PSA and negative prostate biopsies in 2011 and 2017 here for follow-up. Patient's most recent PSA was 6.14 done by PCP. In our records its been as high as 6.46 in the past. He also has history of BPH with LUTS and is on 0.8 mg of tamsulosin as well as a saw palmetto supplement. He has ED for which he uses sildenafil as needed. He is noted decreased orgasm with sexual activity. He is not bothered enough by this to want to try different alpha-blocker.   12/02/2022: 73 year old man with a history of an elevated PSA and negative prostate biopsies in 2011 and 2017, BPH with LUTS currently on Flomax and finasteride, and ED using sildenafil here for follow-up. He feels like the combination of 0.8 mg of Flomax with 5 mg of finasteride is working well for him. He does have decreased orgasm with sexual activity. He is using sildenafil as needed sexual activity. He had an MRI in June 2023 that showed a PI-RADS 2 lesion with a 58 g prostate.   12/29/2023: 73 year old male with a history of elevated PSA as well as negative prostate biopsy in 2011 and 2017 also with BPH with LUTS on Flomax and finasteride here for follow-up. He has ED and uses sildenafil. He recently she  had flank pain and family history of RCC. Renal ultrasound was obtained which showed a 1 cm lower pole calculus.     ALLERGIES: No Known Allergies    MEDICATIONS: Allopurinol 100 MG Tablet  Finasteride 5 MG Tablet 1 tablet PO Daily  Finasteride 5 MG Tablet 1 tablet PO Daily  Omeprazole 20 MG Capsule Delayed Release  Sildenafil Citrate 20 MG Tablet 1-2 tablet PO PRN  Atorvastatin Calcium 20 MG Tablet  Losartan Potassium 100 MG Tablet  traZODone HCl 100 MG Tablet  Venlafaxine HCl 75 MG Tablet  Zolpidem Tartrate     GU PSH: No GU PSH    NON-GU PSH: Appendectomy     GU PMH: Family history of kidney cancer - 12/13/2023, - 12/06/2023 Flank Pain - 12/13/2023, - 12/06/2023 BPH w/LUTS - 12/06/2023, - 12/02/2022, - 2023, - 2022, He has BPH by exam. He does have some obstructive symptoms and also has some irritative symptoms so I am going to have him try tamsulosin, - 2021 ED due to arterial insufficiency - 12/06/2023, - 12/02/2022, - 2023, - 2022, He has a history of erectile dysfunction that did not respond to 20 mg dose of sildenafil so he wanted to try a higher dosage. That was dispensed today., - 2021 Elevated PSA - 12/06/2023, - 12/02/2022, - 2023, - 2022, His latest PSA was noted to be elevated. I told him it  has varied in the past but I can't say that it is not beginning to rise consistently now so when I need to do is check a PSA and then tentatively plan to see him back in 6 months for DRE and PSA. His prostate was noted to be diffusely firm but I did not feel any worrisome nodules., - 2021 Urinary Hesitancy - 12/06/2023, - 12/02/2022, - 2023, - 2022, He has hesitancy and a slow stream with nocturia and incomplete emptying of his bladder but also has some urgency and occasionally has some urge incontinence. My hope is that he will see improvement on tamsulosin., - 2021      PMH Notes: Elevated PSA:  PSA 6/11 - 4.04  TRUS/Bx 10/14/09 - BPH only  PSA 10/17 - 5.28  TRUS/Bx 02/21/16 - BPH only  (including transition zone biopsies)   NON-GU PMH: Anxiety Arthritis Depression GERD Gout Hypertension Sleep Apnea    FAMILY HISTORY: Blood in the urine - Father Kidney Cancer - Father Kidney Stones - Father   SOCIAL HISTORY: Marital Status: Married Preferred Language: English; Ethnicity: Not Hispanic Or Latino; Race: White Current Smoking Status: Patient does not smoke anymore. Smoked for 10 years. Smoked 1 pack per day.   Tobacco Use Assessment Completed: Used Tobacco in last 30 days? Has never drank.  Drinks 2 caffeinated drinks per day.    REVIEW OF SYSTEMS:    GU Review Male:   Patient denies frequent urination, hard to postpone urination, burning/ pain with urination, get up at night to urinate, leakage of urine, stream starts and stops, trouble starting your stream, have to strain to urinate , erection problems, and penile pain.  Gastrointestinal (Upper):   Patient denies nausea, vomiting, and indigestion/ heartburn.  Gastrointestinal (Lower):   Patient denies constipation and diarrhea.  Constitutional:   Patient denies fever, night sweats, weight loss, and fatigue.  Skin:   Patient denies skin rash/ lesion and itching.  Eyes:   Patient denies blurred vision and double vision.  Ears/ Nose/ Throat:   Patient denies sore throat and sinus problems.  Hematologic/Lymphatic:   Patient denies swollen glands and easy bruising.  Cardiovascular:   Patient denies leg swelling and chest pains.  Respiratory:   Patient denies cough and shortness of breath.  Endocrine:   Patient denies excessive thirst.  Musculoskeletal:   Patient denies back pain and joint pain.  Neurological:   Patient denies headaches and dizziness.  Psychologic:   Patient denies depression and anxiety.   VITAL SIGNS: None   MULTI-SYSTEM PHYSICAL EXAMINATION:    Constitutional: Well-nourished. No physical deformities. Normally developed. Good grooming.  Neck: Neck symmetrical, not swollen. Normal tracheal  position.  Respiratory: No labored breathing, no use of accessory muscles.   Skin: No paleness, no jaundice, no cyanosis. No lesion, no ulcer, no rash.  Neurologic / Psychiatric: Oriented to time, oriented to place, oriented to person. No depression, no anxiety, no agitation.  Eyes: Normal conjunctivae. Normal eyelids.  Ears, Nose, Mouth, and Throat: Left ear no scars, no lesions, no masses. Right ear no scars, no lesions, no masses. Nose no scars, no lesions, no masses. Normal hearing. Normal lips.  Musculoskeletal: Normal gait and station of head and neck.     Complexity of Data:  Records Review:   Previous Patient Records, POC Tool  Urine Test Review:   Urinalysis  X-Ray Review: KUB: Reviewed Films. Discussed With Patient. Clearly visible 9 mm left lower pole calculus consistent with renal ultrasound Renal Ultrasound: Reviewed Films.  Discussed With Patient.     11/29/23 12/02/22 12/23/21 12/28/20 12/19/19 06/25/19 10/18/18 10/19/17  PSA  Total PSA 4.95 ng/mL 0.82 ng/mL 1.25 ng/mL 5.25 ng/mL 6.46 ng/mL 4.08 ng/mL 5.42 ng/dl 6.29 ng/dl  Free PSA 1.41 ng/mL    1.02 ng/mL 0.90 ng/mL    % Free PSA 28 % PSA    16 % PSA 22 % PSA 17.2 %     PROCEDURES:         KUB - 74018  A single view of the abdomen is obtained.      . Patient confirmed No Neulasta OnPro Device.           Visit Complexity - G2211          Urinalysis Dipstick Dipstick Cont'd  Color: Yellow Bilirubin: Neg mg/dL  Appearance: Clear Ketones: Neg mg/dL  Specific Gravity: 8.984 Blood: Neg ery/uL  pH: 6.0 Protein: Trace mg/dL  Glucose: Neg mg/dL Urobilinogen: 0.2 mg/dL    Nitrites: Neg    Leukocyte Esterase: Neg leu/uL    ASSESSMENT:      ICD-10 Details  1 GU:   BPH w/LUTS - N40.1 Chronic, Stable  2   ED due to arterial insufficiency - N52.01 Chronic, Stable  3   Urinary Hesitancy - R39.11 Chronic, Stable  4   Renal calculus - N20.0 Chronic, Stable   PLAN:           Orders X-Rays: KUB           Schedule         Document Letter(s):  Created for Patient: Clinical Summary         Notes:   Urolithiasis:  - KUB clearly shows 9 mm left lower pole calculus  - We discussed management including observation, ESWL and ureteroscopy  - Patient is most interested in ESWL and risks and benefits were discussed with him in detail including but not limited to pain, bleeding, infection, retroperitoneal hematoma, damage to surrounding structures, need for additional intervention   Schedule next available date

## 2024-03-08 ENCOUNTER — Other Ambulatory Visit: Payer: Self-pay

## 2024-03-08 ENCOUNTER — Encounter (HOSPITAL_COMMUNITY): Payer: Self-pay | Admitting: Urology

## 2024-03-08 ENCOUNTER — Ambulatory Visit (HOSPITAL_COMMUNITY)
Admission: RE | Admit: 2024-03-08 | Discharge: 2024-03-08 | Disposition: A | Discharge status: Home / Self Care | Attending: Urology | Admitting: Urology

## 2024-03-08 ENCOUNTER — Encounter (HOSPITAL_COMMUNITY)
Admission: RE | Disposition: A | Discharge status: Home / Self Care | Payer: Self-pay | Source: Home / Self Care | Attending: Urology

## 2024-03-08 ENCOUNTER — Ambulatory Visit (HOSPITAL_COMMUNITY)

## 2024-03-08 DIAGNOSIS — I10 Essential (primary) hypertension: Secondary | ICD-10-CM | POA: Diagnosis not present

## 2024-03-08 DIAGNOSIS — N138 Other obstructive and reflux uropathy: Secondary | ICD-10-CM | POA: Diagnosis not present

## 2024-03-08 DIAGNOSIS — R351 Nocturia: Secondary | ICD-10-CM | POA: Diagnosis not present

## 2024-03-08 DIAGNOSIS — Z8051 Family history of malignant neoplasm of kidney: Secondary | ICD-10-CM | POA: Insufficient documentation

## 2024-03-08 DIAGNOSIS — Z79899 Other long term (current) drug therapy: Secondary | ICD-10-CM | POA: Diagnosis not present

## 2024-03-08 DIAGNOSIS — N5201 Erectile dysfunction due to arterial insufficiency: Secondary | ICD-10-CM | POA: Diagnosis not present

## 2024-03-08 DIAGNOSIS — R3912 Poor urinary stream: Secondary | ICD-10-CM | POA: Insufficient documentation

## 2024-03-08 DIAGNOSIS — N200 Calculus of kidney: Secondary | ICD-10-CM | POA: Insufficient documentation

## 2024-03-08 DIAGNOSIS — R3914 Feeling of incomplete bladder emptying: Secondary | ICD-10-CM | POA: Insufficient documentation

## 2024-03-08 DIAGNOSIS — R3915 Urgency of urination: Secondary | ICD-10-CM | POA: Insufficient documentation

## 2024-03-08 DIAGNOSIS — N401 Enlarged prostate with lower urinary tract symptoms: Secondary | ICD-10-CM | POA: Diagnosis not present

## 2024-03-08 DIAGNOSIS — Z87891 Personal history of nicotine dependence: Secondary | ICD-10-CM | POA: Insufficient documentation

## 2024-03-08 DIAGNOSIS — G4730 Sleep apnea, unspecified: Secondary | ICD-10-CM | POA: Insufficient documentation

## 2024-03-08 DIAGNOSIS — R3911 Hesitancy of micturition: Secondary | ICD-10-CM | POA: Insufficient documentation

## 2024-03-08 HISTORY — DX: Personal history of urinary calculi: Z87.442

## 2024-03-08 HISTORY — DX: Sleep apnea, unspecified: G47.30

## 2024-03-08 HISTORY — PX: EXTRACORPOREAL SHOCK WAVE LITHOTRIPSY: SHX1557

## 2024-03-08 HISTORY — DX: Malignant (primary) neoplasm, unspecified: C80.1

## 2024-03-08 LAB — URINALYSIS, ROUTINE W REFLEX MICROSCOPIC
Bacteria, UA: NONE SEEN
Bilirubin Urine: NEGATIVE
Glucose, UA: NEGATIVE mg/dL
Ketones, ur: NEGATIVE mg/dL
Leukocytes,Ua: NEGATIVE
Nitrite: NEGATIVE
Protein, ur: NEGATIVE mg/dL
Specific Gravity, Urine: 1.023 (ref 1.005–1.030)
pH: 6 (ref 5.0–8.0)

## 2024-03-08 SURGERY — LITHOTRIPSY, ESWL
Anesthesia: LOCAL | Laterality: Left

## 2024-03-08 MED ORDER — SODIUM CHLORIDE 0.9 % IV SOLN: INTRAVENOUS | Status: DC

## 2024-03-08 MED ORDER — TAMSULOSIN HCL 0.4 MG PO CAPS: 0.4000 mg | ORAL_CAPSULE | Freq: Every day | ORAL | 0 refills | Status: AC

## 2024-03-08 MED ORDER — OXYCODONE HCL 5 MG PO TABS: 5.0000 mg | ORAL_TABLET | Freq: Three times a day (TID) | ORAL | 0 refills | Status: AC | PRN

## 2024-03-08 MED ORDER — DIPHENHYDRAMINE HCL 25 MG PO CAPS
25.0000 mg | ORAL_CAPSULE | ORAL | Status: AC
  Administered 2024-03-08: 25 mg via ORAL
  Filled 2024-03-08: qty 1

## 2024-03-08 MED ORDER — CIPROFLOXACIN HCL 500 MG PO TABS
500.0000 mg | ORAL_TABLET | ORAL | Status: AC
  Administered 2024-03-08: 500 mg via ORAL
  Filled 2024-03-08: qty 1

## 2024-03-08 MED ORDER — DIAZEPAM 5 MG PO TABS
5.0000 mg | ORAL_TABLET | Freq: Once | ORAL | Status: AC
  Administered 2024-03-08: 5 mg via ORAL
  Filled 2024-03-08: qty 1

## 2024-03-08 MED ORDER — ONDANSETRON HCL 4 MG PO TABS: 4.0000 mg | ORAL_TABLET | Freq: Every day | ORAL | 1 refills | Status: AC | PRN

## 2024-03-08 MED ORDER — BISACODYL 5 MG PO TBEC: 5.0000 mg | DELAYED_RELEASE_TABLET | Freq: Every day | ORAL | Status: DC | PRN

## 2024-03-08 MED ORDER — DIAZEPAM 5 MG PO TABS: 10.0000 mg | ORAL_TABLET | ORAL | Status: DC

## 2024-03-08 NOTE — Op Note (Signed)
 See [facility] OP note scanned into chart. Also because of the size, density, location and other factors that cannot be anticipated I feel this will likely be a staged procedure. This fact supersedes any indication in the scanned [facility] operative note to the contrary.

## 2024-03-08 NOTE — Discharge Instructions (Signed)
 See [facility] discharge instructions in chart.

## 2024-03-11 ENCOUNTER — Encounter (HOSPITAL_COMMUNITY): Payer: Self-pay | Admitting: Urology
